# Patient Record
Sex: Female | Born: 1950 | Race: White | Hispanic: No | Marital: Married | Smoking: Never smoker
Health system: Southern US, Community
[De-identification: ages and names within clinical notes are randomized; demographics above are authoritative.]

## PROBLEM LIST (undated history)

## (undated) HISTORY — PX: TUBAL LIGATION: SHX77

## (undated) HISTORY — PX: TONSILLECTOMY: SUR1361

---

## 2013-11-19 ENCOUNTER — Emergency Department (HOSPITAL_BASED_OUTPATIENT_CLINIC_OR_DEPARTMENT_OTHER)
Admission: EM | Admit: 2013-11-19 | Discharge: 2013-11-19 | Disposition: A | Discharge status: Home / Self Care | Payer: 59 | Attending: Emergency Medicine | Admitting: Emergency Medicine

## 2013-11-19 ENCOUNTER — Encounter (HOSPITAL_BASED_OUTPATIENT_CLINIC_OR_DEPARTMENT_OTHER): Payer: Self-pay | Admitting: Emergency Medicine

## 2013-11-19 ENCOUNTER — Emergency Department (HOSPITAL_BASED_OUTPATIENT_CLINIC_OR_DEPARTMENT_OTHER): Payer: 59

## 2013-11-19 DIAGNOSIS — Y939 Activity, unspecified: Secondary | ICD-10-CM | POA: Insufficient documentation

## 2013-11-19 DIAGNOSIS — H05231 Hemorrhage of right orbit: Secondary | ICD-10-CM

## 2013-11-19 DIAGNOSIS — W1809XA Striking against other object with subsequent fall, initial encounter: Secondary | ICD-10-CM | POA: Insufficient documentation

## 2013-11-19 DIAGNOSIS — Y9229 Other specified public building as the place of occurrence of the external cause: Secondary | ICD-10-CM | POA: Insufficient documentation

## 2013-11-19 DIAGNOSIS — S0510XA Contusion of eyeball and orbital tissues, unspecified eye, initial encounter: Secondary | ICD-10-CM | POA: Insufficient documentation

## 2013-11-19 DIAGNOSIS — W19XXXA Unspecified fall, initial encounter: Secondary | ICD-10-CM

## 2013-11-19 NOTE — Discharge Instructions (Signed)
Return to the ED with any concerns including increased pain, changes in vision, vomiting, decreased level of alertness/lethargy, or any other alarming symptoms

## 2013-11-19 NOTE — ED Provider Notes (Signed)
CSN: [id number]     Arrival date & time 11/19/13  1727 History   First MD Initiated Contact with Patient 11/19/13 1804     Chief Complaint  Patient presents with  . Fall   (Consider location/radiation/quality/duration/timing/severity/associated sxs/prior Treatment) HPI Pt presenting with c/o fall yesterday and hitting her face on cement outside of a shopping [place].  She states she felt her foot was caught between the pavement and she pulled forward causing her to lose balance and fall forward hitting her face.  No LOC, no vomiting or seizure activity.  Today the area around her brow and around her eye has been more swollen and discolored.  No changes in vision.  No eye pain.  Saw her PMD today who recommended coming to the ED for evaluation.  There are no other associated systemic symptoms, there are no other alleviating or modifying factors.   History reviewed. No pertinent past medical history. Past Surgical History  Procedure Laterality Date  . Cesarean section    . Tubal ligation    . Tonsillectomy     History reviewed. No pertinent family history. History  Substance Use Topics  . Smoking status: Never Smoker   . Smokeless tobacco: Not on file  . Alcohol Use: No   OB History   Grav Para Term Preterm Abortions TAB SAB Ect Mult Living                 Review of Systems ROS reviewed and all otherwise negative except for mentioned in HPI  Allergies  Review of patient's allergies indicates no known allergies.  Home Medications  No current outpatient prescriptions on file. BP 169/79  Pulse 87  Temp(Src) 97.4 F (36.3 C) (Oral)  Resp 16  Ht 5' (1.524 m)  Wt 145 lb (65.772 kg)  BMI 28.32 kg/m2  SpO2 100% Vitals reviewed Physical Exam Physical Examination: General appearance - alert, well appearing, and in no distress Mental status - alert, oriented to person, place, and time Eyes - pupils equal and reactive, extraocular eye movements intact, large periorbital hematoma on  right with area of hematoma originating over lateral right brow, no breaks in skin Mouth - mucous membranes moist, pharynx normal without lesions Neck - supple, no significant adenopathy, no midline tenderness to palpation Chest - clear to auscultation, no wheezes, rales or rhonchi, symmetric air entry Heart - normal rate, regular rhythm, normal S1, S2, no murmurs, rubs, clicks or gallops Neurological - alert, oriented, normal speech, no focal findings or movement disorder noted Extremities - peripheral pulses normal, no pedal edema, no clubbing or cyanosis Skin - normal coloration and turgor, no rashes, periorbital hematoma as above  ED Course  Procedures (including critical care time) Labs Review Labs Reviewed - No data to display Imaging Review Ct Maxillofacial Wo Cm  11/19/2013   CLINICAL DATA:  Fall, injury, pain and swelling around right eye.  EXAM: CT MAXILLOFACIAL WITHOUT CONTRAST  TECHNIQUE: Multidetector CT imaging of the maxillofacial structures was performed. Multiplanar CT image reconstructions were also generated. A small metallic BB was placed on the right temple in order to reliably differentiate right from left.  COMPARISON:  None available for comparison at time of study interpretation.  FINDINGS: No facial fracture.  Mandible condyles are located.  Large right frontal scalp hematoma with periorbital soft tissue swelling, no postseptal hematoma. No radiopaque foreign bodies or subcutaneous gas. Ocular globes and orbital contents are unremarkable.  Paranasal sinus are well-aerated. No destructive bony lesions. Nasal septum mildly deviated  left with tiny bony spur. Patent sinonasal ostia.  Fatty parotid glands. 9 mm left thyroid hypodense nodule be better characterized on thyroid sonogram as clinically indicated.  IMPRESSION: No facial fracture. Large right frontal scalp hematoma with periorbital soft tissue swelling, no postseptal hematoma.   Electronically Signed   By: Awilda Metro   On: 11/19/2013 19:08    EKG Interpretation   None       MDM   1. Periorbital hematoma of right eye   2. Fall, initial encounter    Pt presenting after mechanical fall yesterday, has large periorbital hematoma on right.  EOM are full, no entrapment, no eye pain.  No LOC, no vomiting or seizure activity.  CT scan shows no evidence of facial fracture.  Discharged with strict return precautions.  Pt agreeable with plan.    Ethelda Chick, MD 11/19/13 [phone]

## 2013-11-19 NOTE — ED Notes (Signed)
Pt c/o left facial injury x 1 day ago hitting right side of face on cement floor

## 2015-06-23 IMAGING — CT CT MAXILLOFACIAL W/O CM
3 series · 16 of 47 positions shown, 19 images · non-contrast
Comparison: None available for comparison at time of study
interpretation.

CLINICAL DATA: Fall, injury, pain and swelling around right eye.

EXAM:
CT MAXILLOFACIAL WITHOUT CONTRAST
TECHNIQUE: Multidetector CT imaging of the maxillofacial structures was
performed. Multiplanar CT image reconstructions were also generated.
A small metallic BB was placed on the right temple in order to
reliably differentiate right from left.

[Series 3: maxillofacial 2.0 h30s st · axial · 0.32mm/px · z∈[-223,-91]mm · 10 of 78 slices shown, 13 images]
[im 6/78  brain]
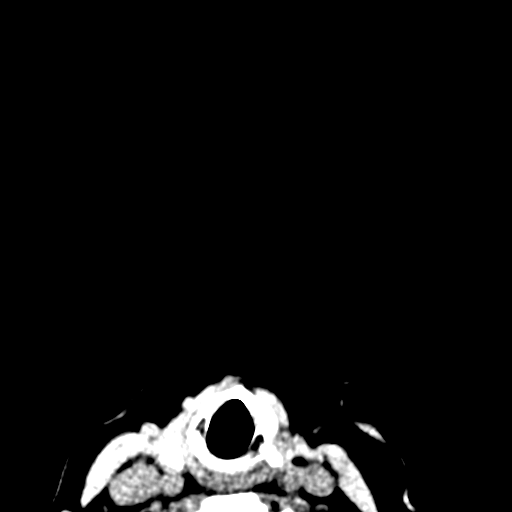
[im 6/78  bone]
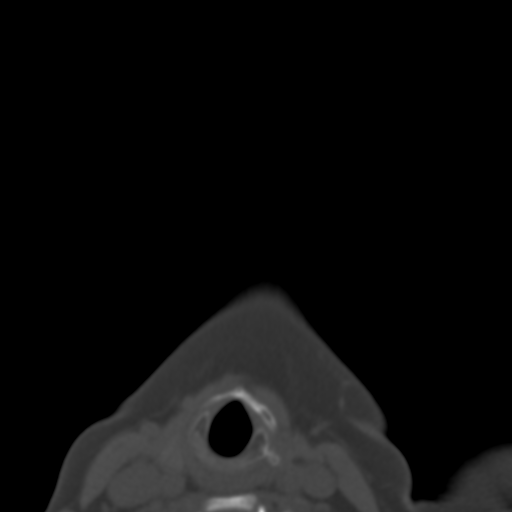
[im 14/78  bone]
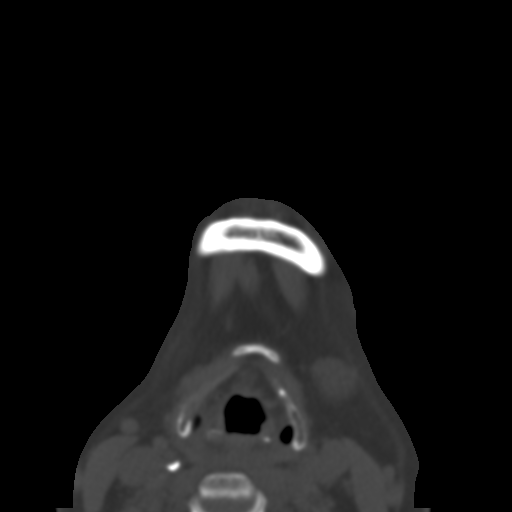
[im 22/78  bone]
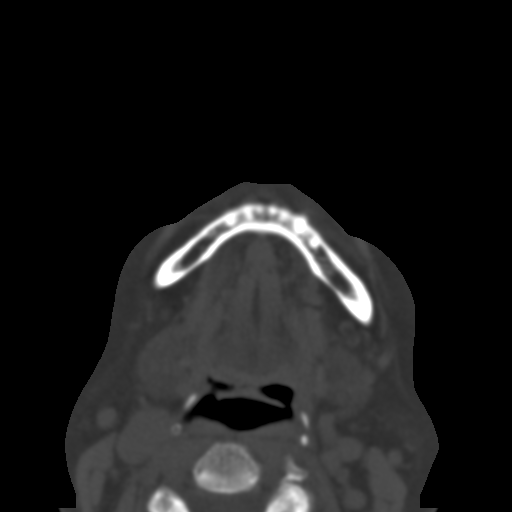
[im 27/78  bone]
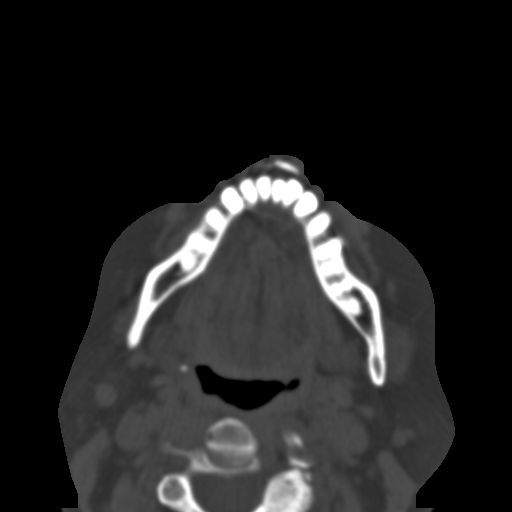
[im 35/78  brain]
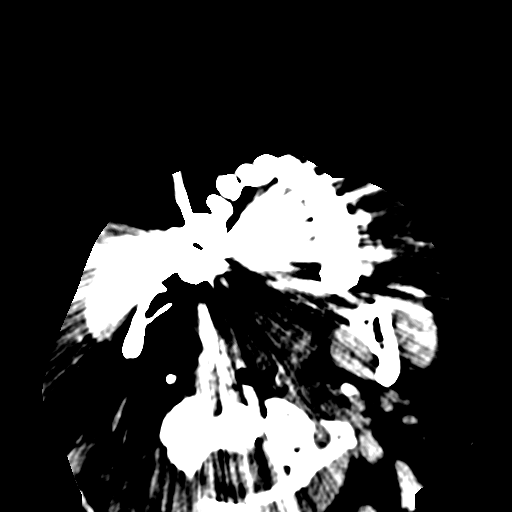
[im 35/78  bone]
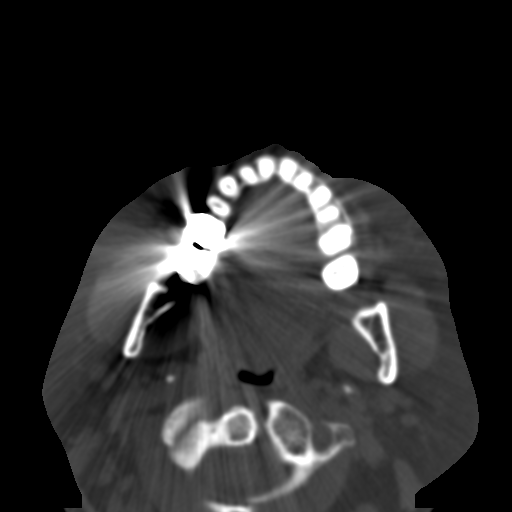
[im 43/78  bone]
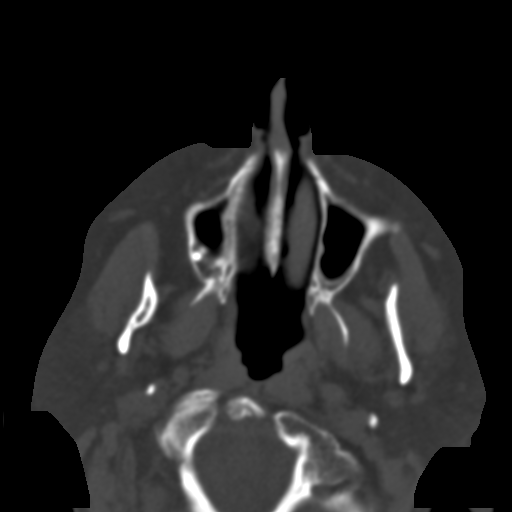
[im 51/78  bone]
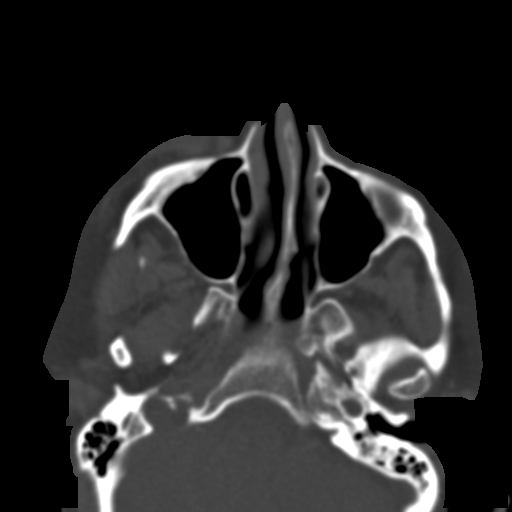
[im 59/78  bone]
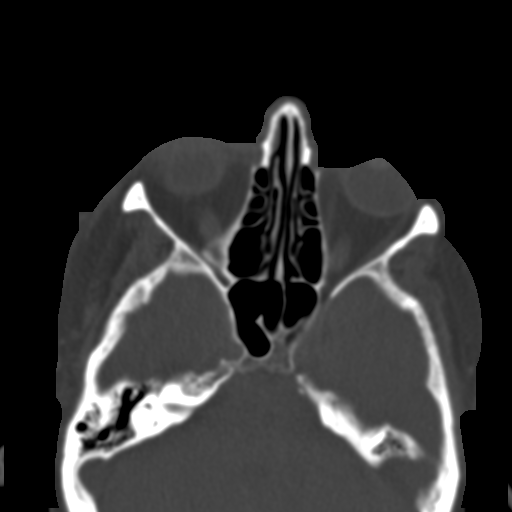
[im 64/78  brain]
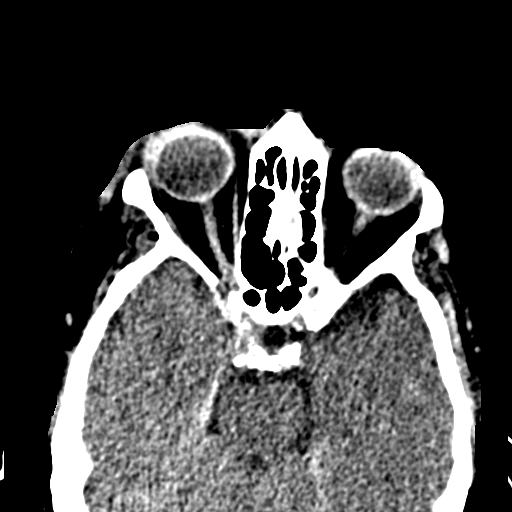
[im 64/78  bone]
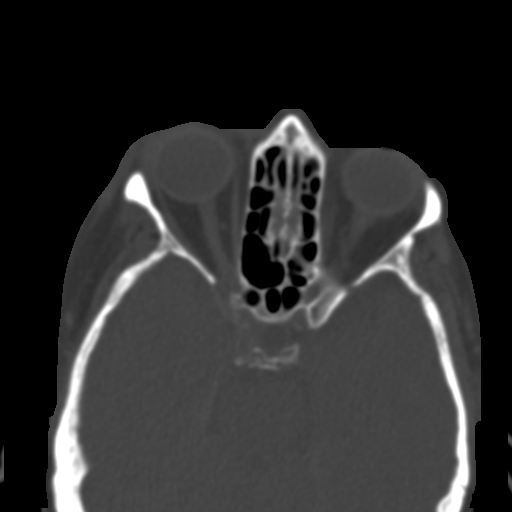
[im 72/78  bone]
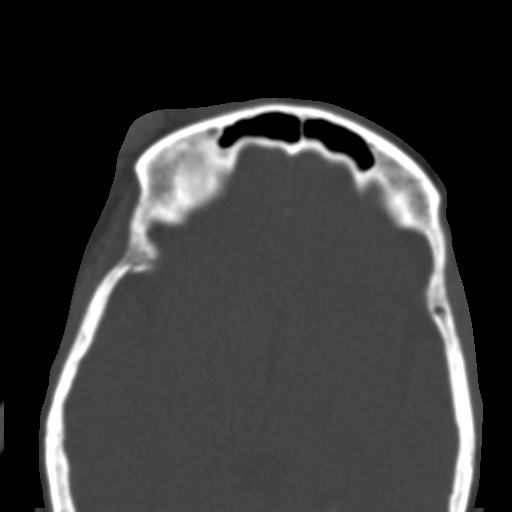

[Series 8: maxillofacial 2.0 coronal · coronal · 0.33mm/px · 3 of 66 slices shown]
[im 22/66  bone]
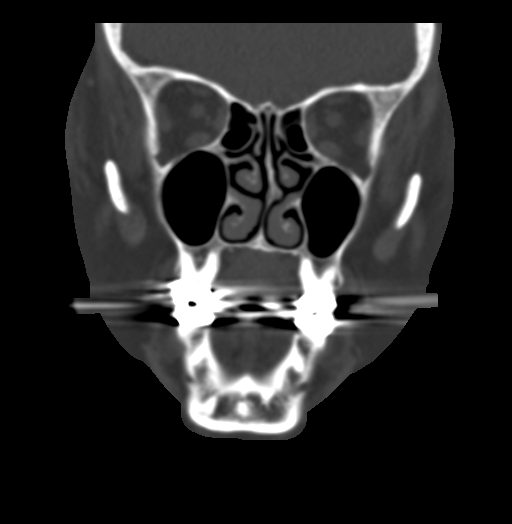
[im 29/66  bone]
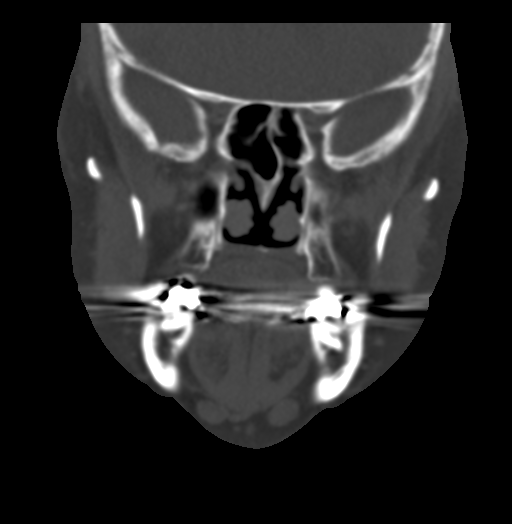
[im 37/66  bone]
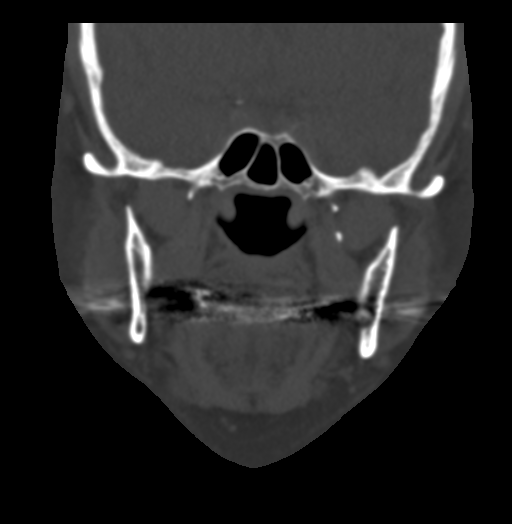

[Series 9: maxillofacial 2.0 sagittal · sagittal · 0.31mm/px · 3 of 75 slices shown]
[im 25/75  bone]
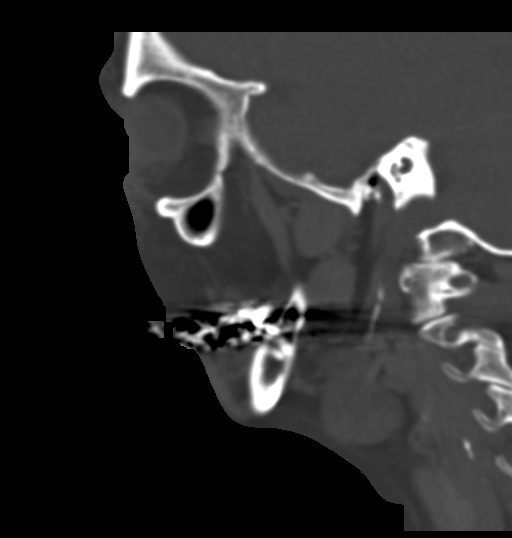
[im 38/75  bone]
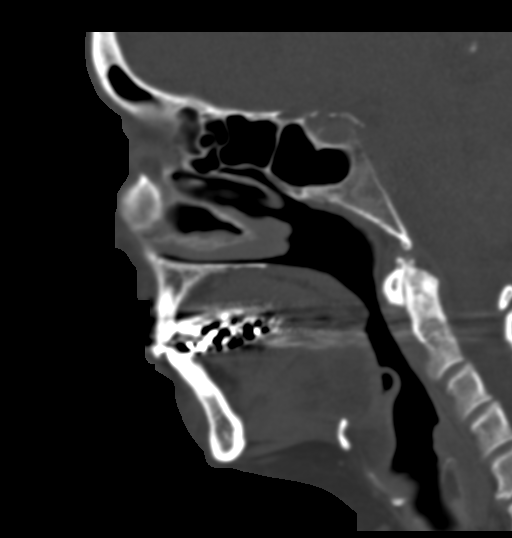
[im 50/75  bone]
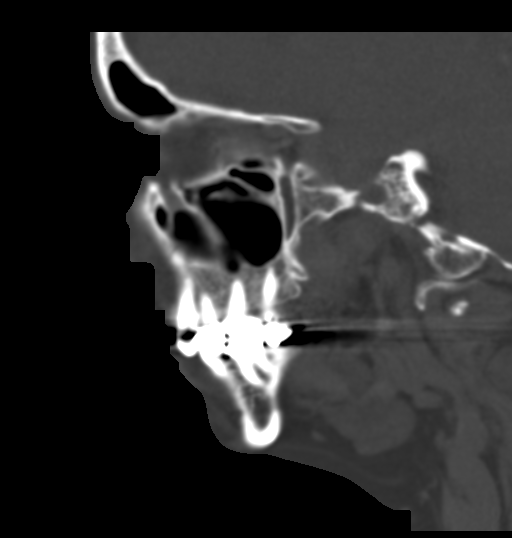

[16 of 47 positions shown; findings below may reference images not displayed]

FINDINGS: No facial fracture.  Mandible condyles are located.

Large right frontal scalp hematoma with periorbital soft tissue
swelling, no postseptal hematoma. No radiopaque foreign bodies or
subcutaneous gas. Ocular globes and orbital contents are
unremarkable.

Paranasal sinus are well-aerated. No destructive bony lesions. Nasal
septum mildly deviated left with tiny bony spur. Patent sinonasal
ostia.

Fatty parotid glands. 9 mm left thyroid hypodense nodule be better
characterized on thyroid sonogram as clinically indicated.
IMPRESSION: No facial fracture. Large right frontal scalp hematoma with
periorbital soft tissue swelling, no postseptal hematoma.

  By: Temesgen Paramo

## 2024-07-07 ENCOUNTER — Other Ambulatory Visit (HOSPITAL_COMMUNITY): Payer: Self-pay
